# Patient Record
Sex: Female | Born: 1945 | Race: Black or African American | Hispanic: No | Marital: Married | Smoking: Never smoker
Health system: Southern US, Community
[De-identification: ages and names within clinical notes are randomized; demographics above are authoritative.]

## PROBLEM LIST (undated history)

## (undated) DIAGNOSIS — Z992 Dependence on renal dialysis: Secondary | ICD-10-CM

## (undated) DIAGNOSIS — N289 Disorder of kidney and ureter, unspecified: Secondary | ICD-10-CM

## (undated) DIAGNOSIS — E119 Type 2 diabetes mellitus without complications: Secondary | ICD-10-CM

## (undated) HISTORY — PX: HAND AMPUTATION: SUR26

## (undated) HISTORY — PX: LEG AMPUTATION: SHX1105

## (undated) HISTORY — PX: COLOSTOMY: SHX63

## (undated) HISTORY — PX: ABDOMINAL HYSTERECTOMY: SHX81

---

## 2014-09-30 ENCOUNTER — Emergency Department (HOSPITAL_BASED_OUTPATIENT_CLINIC_OR_DEPARTMENT_OTHER)
Admission: EM | Admit: 2014-09-30 | Discharge: 2014-09-30 | Disposition: A | Discharge status: Home / Self Care | Payer: Medicare Other | Attending: Emergency Medicine | Admitting: Emergency Medicine

## 2014-09-30 ENCOUNTER — Emergency Department (HOSPITAL_BASED_OUTPATIENT_CLINIC_OR_DEPARTMENT_OTHER): Payer: Medicare Other

## 2014-09-30 ENCOUNTER — Encounter (HOSPITAL_BASED_OUTPATIENT_CLINIC_OR_DEPARTMENT_OTHER): Payer: Self-pay | Admitting: *Deleted

## 2014-09-30 DIAGNOSIS — IMO0002 Reserved for concepts with insufficient information to code with codable children: Secondary | ICD-10-CM

## 2014-09-30 DIAGNOSIS — Z992 Dependence on renal dialysis: Secondary | ICD-10-CM | POA: Diagnosis not present

## 2014-09-30 DIAGNOSIS — M79645 Pain in left finger(s): Secondary | ICD-10-CM | POA: Diagnosis present

## 2014-09-30 DIAGNOSIS — L03012 Cellulitis of left finger: Secondary | ICD-10-CM | POA: Insufficient documentation

## 2014-09-30 DIAGNOSIS — E119 Type 2 diabetes mellitus without complications: Secondary | ICD-10-CM | POA: Diagnosis not present

## 2014-09-30 DIAGNOSIS — N186 End stage renal disease: Secondary | ICD-10-CM | POA: Diagnosis not present

## 2014-09-30 HISTORY — DX: Type 2 diabetes mellitus without complications: E11.9

## 2014-09-30 HISTORY — DX: Disorder of kidney and ureter, unspecified: N28.9

## 2014-09-30 HISTORY — DX: Dependence on renal dialysis: Z99.2

## 2014-09-30 LAB — CBC WITH DIFFERENTIAL/PLATELET
BASOS ABS: 0.1 10*3/uL (ref 0.0–0.1)
Basophils Relative: 1 % (ref 0–1)
EOS PCT: 2 % (ref 0–5)
Eosinophils Absolute: 0.1 10*3/uL (ref 0.0–0.7)
HEMATOCRIT: 52.8 % — AB (ref 36.0–46.0)
Hemoglobin: 16.7 g/dL — ABNORMAL HIGH (ref 12.0–15.0)
LYMPHS PCT: 20 % (ref 12–46)
Lymphs Abs: 1.5 10*3/uL (ref 0.7–4.0)
MCH: 28.4 pg (ref 26.0–34.0)
MCHC: 31.6 g/dL (ref 30.0–36.0)
MCV: 89.6 fL (ref 78.0–100.0)
Monocytes Absolute: 0.6 10*3/uL (ref 0.1–1.0)
Monocytes Relative: 8 % (ref 3–12)
Neutro Abs: 5.3 10*3/uL (ref 1.7–7.7)
Neutrophils Relative %: 69 % (ref 43–77)
Platelets: 274 10*3/uL (ref 150–400)
RBC: 5.89 MIL/uL — ABNORMAL HIGH (ref 3.87–5.11)
RDW: 20.6 % — ABNORMAL HIGH (ref 11.5–15.5)
WBC: 7.7 10*3/uL (ref 4.0–10.5)

## 2014-09-30 LAB — BASIC METABOLIC PANEL
ANION GAP: 19 — AB (ref 5–15)
BUN: 35 mg/dL — ABNORMAL HIGH (ref 6–23)
CO2: 22 meq/L (ref 19–32)
Calcium: 7.8 mg/dL — ABNORMAL LOW (ref 8.4–10.5)
Chloride: 95 mEq/L — ABNORMAL LOW (ref 96–112)
Creatinine, Ser: 6.8 mg/dL — ABNORMAL HIGH (ref 0.50–1.10)
GFR calc Af Amer: 6 mL/min — ABNORMAL LOW (ref >90)
GFR calc non Af Amer: 6 mL/min — ABNORMAL LOW (ref >90)
Glucose, Bld: 163 mg/dL — ABNORMAL HIGH (ref 70–99)
Potassium: 4.5 mEq/L (ref 3.7–5.3)
Sodium: 136 mEq/L — ABNORMAL LOW (ref 137–147)

## 2014-09-30 LAB — I-STAT CG4 LACTIC ACID, ED: Lactic Acid, Venous: 1.8 mmol/L (ref 0.5–2.2)

## 2014-09-30 MED ORDER — DIPHENHYDRAMINE HCL 50 MG/ML IJ SOLN
25.0000 mg | Freq: Once | INTRAMUSCULAR | Status: AC
  Administered 2014-09-30: 25 mg via INTRAVENOUS
  Filled 2014-09-30: qty 1

## 2014-09-30 MED ORDER — TRAMADOL HCL 50 MG PO TABS: 50.0000 mg | ORAL_TABLET | Freq: Four times a day (QID) | ORAL | Status: AC | PRN

## 2014-09-30 MED ORDER — PIPERACILLIN-TAZOBACTAM 3.375 G IVPB 30 MIN
3.3750 g | Freq: Once | INTRAVENOUS | Status: AC
  Administered 2014-09-30: 3.375 g via INTRAVENOUS
  Filled 2014-09-30 (×2): qty 50

## 2014-09-30 MED ORDER — MORPHINE SULFATE 4 MG/ML IJ SOLN
4.0000 mg | Freq: Once | INTRAMUSCULAR | Status: AC
  Administered 2014-09-30: 4 mg via INTRAVENOUS
  Filled 2014-09-30: qty 1

## 2014-09-30 MED ORDER — CEPHALEXIN 250 MG PO CAPS: 250.0000 mg | ORAL_CAPSULE | Freq: Two times a day (BID) | ORAL | Status: AC

## 2014-09-30 NOTE — Discharge Instructions (Signed)
Take keflex as prescribed.   Take tramadol as needed for pain.   Soak finger in warm water twice a day and try to express some pus out.   [facility] or return to ED here in 2 days for recheck.  Return earlier if you have fever, severe pain, purulent discharge from hand.

## 2014-09-30 NOTE — ED Notes (Signed)
Patient transported to X-ray 

## 2014-09-30 NOTE — ED Notes (Signed)
D/c home with family- Rx x 2 given for keflex and ultram

## 2014-09-30 NOTE — ED Notes (Signed)
Pain in her left hand for a few days. Fingers are swollen. She states they are full of pus. Hx of diabetes. 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

## 2014-09-30 NOTE — ED Notes (Signed)
MD at bedside. 

## 2014-09-30 NOTE — ED Provider Notes (Addendum)
CSN: [id number]     Arrival date & time 09/30/14  1541 History   First MD Initiated Contact with Patient 09/30/14 1544     Chief Complaint  Patient presents with  . Hand Pain     (Consider location/radiation/quality/duration/timing/severity/associated sxs/prior Treatment) The history is provided by the patient.  Amanda Gomez is a 68 y.o. female hx of DM, ESRD on HD (last HD was yesterday) here with L middle finger infection. Noticed swelling L middle finger for the last 4 days. Today she had a fingerstick done and the nurse noticed that there is pus draining out of the left middle finger. Denies any fevers or chills or abdominal pain or chest pain or cough.    Past Medical History  Diagnosis Date  . Diabetes mellitus without complication   . Renal disorder   . Dialysis patient    Past Surgical History  Procedure Laterality Date  . Hand amputation    . Leg amputation    . Abdominal hysterectomy    . Colostomy     No family history on file. History  Substance Use Topics  . Smoking status: Never Smoker   . Smokeless tobacco: Not on file  . Alcohol Use: Yes   OB History    No data available     Review of Systems  Musculoskeletal:       L middle finger swelling   All other systems reviewed and are negative.     Allergies  Review of patient's allergies indicates no known allergies.  Home Medications   Prior to Admission medications   Not on File   BP 81/44 mmHg  Pulse 60  Temp(Src) 98.4 F (36.9 C) (Oral)  Resp 20  Ht 5\' 7"  (1.702 m)  Wt 217 lb (98.431 kg)  BMI 33.98 kg/m2  SpO2 98% Physical Exam  Constitutional: She is oriented to person, place, and time.  Chronically ill   HENT:  Head: Normocephalic.  Mouth/Throat: Oropharynx is clear and moist.  Eyes: Conjunctivae are normal. Pupils are equal, round, and reactive to light.  Neck: Normal range of motion. Neck supple.  Cardiovascular: Normal rate and regular rhythm.   Pulmonary/Chest: Effort normal  and breath sounds normal. No respiratory distress. She has no wheezes. She has no rales.  Abdominal: Soft. Bowel sounds are normal. She exhibits no distension. There is no tenderness. There is no rebound.  Musculoskeletal:  1+ edema bilaterally. L middle finger with obvious paronychia. No obvious tenderness on the palmar aspect of the finger.   Neurological: She is alert and oriented to person, place, and time. No cranial nerve deficit. Coordination normal.  Skin: Skin is warm and dry.  Psychiatric: She has a normal mood and affect. Her behavior is normal. Judgment and thought content normal.  Nursing note and vitals reviewed.   ED Course  Procedures (including critical care time)       Labs Review Labs Reviewed  CBC WITH DIFFERENTIAL - Abnormal; Notable for the following:    RBC 5.89 (*)    Hemoglobin 16.7 (*)    HCT 52.8 (*)    RDW 20.6 (*)    All other components within normal limits  BASIC METABOLIC PANEL - Abnormal; Notable for the following:    Sodium 136 (*)    Chloride 95 (*)    Glucose, Bld 163 (*)    BUN 35 (*)    Creatinine, Ser 6.80 (*)    Calcium 7.8 (*)    GFR calc non Af [place]  6 (*)    GFR calc Af Amer 6 (*)    Anion gap 19 (*)    All other components within normal limits  I-STAT CG4 LACTIC ACID, ED    Imaging Review Dg Finger Middle Left  09/30/2014   CLINICAL DATA:  Pain and swelling of the distal left middle finger.  EXAM: LEFT MIDDLE FINGER 2+V  COMPARISON:  None.  FINDINGS: There is no evidence of fracture or dislocation. Degenerative joint changes with osteophyte formation are noted in the distal interphalangeal joint. There is generalized soft tissue swelling of the distal middle finger.  IMPRESSION: It is generalized soft tissue swelling of the distal left middle finger. There is no evidence of osteomyelitis.   Electronically Signed   By: Sherian ReinWei-Chen  Lin M.D.   On: 09/30/2014 16:21     EKG Interpretation None      MDM   Final diagnoses:  Wound  abscess    Amanda Gomez is a 68 y.o. female here with L middle finger paronychia. Given that she is diabetic and had multiple amputations previously, I am concerned that the infection may spread. Will perform I and D. Will give zosyn and call hand surgery.   6:45 PM I called Dr. Izora Ribasoley from hand surgery. He recommend d/c with abx. WBC nl. Lactate normal. Patient's BP in the 80s-90s but per patient this is baseline. Doesn't appear septic. I did drain the paronychia and xray showed no obvious osteo. I feel that she needs to come back in 2 days for wound check since she is at risk for sepsis or osteo or felon from the paronychia.     Richardean Canalavid H Yao, MD 09/30/14 [id number]  Richardean Canalavid H Yao, MD 10/01/14 [phone]

## 2014-10-02 ENCOUNTER — Emergency Department (HOSPITAL_BASED_OUTPATIENT_CLINIC_OR_DEPARTMENT_OTHER)
Admission: EM | Admit: 2014-10-02 | Discharge: 2014-10-02 | Disposition: A | Discharge status: Home / Self Care | Payer: Medicare Other | Attending: Emergency Medicine | Admitting: Emergency Medicine

## 2014-10-02 ENCOUNTER — Encounter (HOSPITAL_BASED_OUTPATIENT_CLINIC_OR_DEPARTMENT_OTHER): Payer: Self-pay

## 2014-10-02 DIAGNOSIS — Z792 Long term (current) use of antibiotics: Secondary | ICD-10-CM | POA: Diagnosis not present

## 2014-10-02 DIAGNOSIS — Z4801 Encounter for change or removal of surgical wound dressing: Secondary | ICD-10-CM | POA: Insufficient documentation

## 2014-10-02 DIAGNOSIS — E119 Type 2 diabetes mellitus without complications: Secondary | ICD-10-CM | POA: Diagnosis not present

## 2014-10-02 DIAGNOSIS — Z992 Dependence on renal dialysis: Secondary | ICD-10-CM | POA: Insufficient documentation

## 2014-10-02 DIAGNOSIS — Z5189 Encounter for other specified aftercare: Secondary | ICD-10-CM

## 2014-10-02 MED ORDER — BACITRACIN 500 UNIT/GM EX OINT
1.0000 "application " | TOPICAL_OINTMENT | Freq: Two times a day (BID) | CUTANEOUS | Status: DC
  Filled 2014-10-02: qty 0.9

## 2014-10-02 NOTE — ED Notes (Addendum)
Pt reports she was seen Friday night for a finger infection - left hand middle finger - reports being on PO antibiotics and states she was instructed to return to ED for recheck. Pt does not know home medications.

## 2014-10-02 NOTE — ED Provider Notes (Signed)
CSN: [id number]     Arrival date & time 10/02/14  1521 History   First MD Initiated Contact with Patient 10/02/14 1637     Chief Complaint  Patient presents with  . Wound Check     (Consider location/radiation/quality/duration/timing/severity/associated sxs/prior Treatment) Patient is a 68 y.o. female presenting with wound check. The history is provided by the patient.  Wound Check This is a new problem.   Amanda Gomez is a 68 y.o. female who presents to the ED for recheck of her finger. She states she was instructed to return for recheck. The infection is in the left middle finger. Dr. Silverio LayYao did I&D of paronychia at her visit. Patient states that when she got home from her last visit the nail came off. She has been soaking the finger and taking the antibiotics and the finger feels much better. Patient is a diabetic and a dialysis patient. She has also had amputations due to infections so is being followed closely.   Past Medical History  Diagnosis Date  . Diabetes mellitus without complication   . Renal disorder   . Dialysis patient    Past Surgical History  Procedure Laterality Date  . Hand amputation    . Leg amputation    . Abdominal hysterectomy    . Colostomy     History reviewed. No pertinent family history. History  Substance Use Topics  . Smoking status: Never Smoker   . Smokeless tobacco: Not on file  . Alcohol Use: No   OB History    No data available     Review of Systems Negative except as stated in HPI   Allergies  Review of patient's allergies indicates no known allergies.  Home Medications   Prior to Admission medications   Medication Sig Start Date End Date Taking? Authorizing Provider  cephALEXin (KEFLEX) 250 MG capsule Take 1 capsule (250 mg total) by mouth 2 (two) times daily. 09/30/14  Yes Richardean Canalavid H Yao, MD  traMADol (ULTRAM) 50 MG tablet Take 1 tablet (50 mg total) by mouth every 6 (six) hours as needed. 09/30/14  Yes Richardean Canalavid H Yao, MD   BP 107/63  mmHg  Pulse 62  Temp(Src) 98.1 F (36.7 C) (Oral)  Resp 16  Ht 5\' 7"  (1.702 m)  Wt 217 lb (98.431 kg)  BMI 33.98 kg/m2  SpO2 99% Physical Exam  Constitutional: She is oriented to person, place, and time. She appears well-developed and well-nourished. No distress.  HENT:  Head: Normocephalic.  Eyes: EOM are normal.  Neck: Neck supple.  Pulmonary/Chest: Effort normal.  Abdominal: Soft. There is no tenderness.  Musculoskeletal:       Hands: Nail for left middle finger is off and wound is healing well. In comparison to pictures in char from Dr. Silverio LayYao the wound is much improved.   Neurological: She is alert and oriented to person, place, and time. No cranial nerve deficit.  Skin: Skin is warm and dry.  Psychiatric: She has a normal mood and affect. Her behavior is normal.  Nursing note and vitals reviewed.   ED Course  Procedures   MDM  68 y.o. female her for wound recheck. Much improved since visit 2 days ago. Will continue antibiotics and wound care. She will follow up with her PCP in 2 days or return here for any problems.      [address]Hope MarengoM Neese, NP 10/02/14 1657  Nelia Shiobert L Beaton, MD 10/03/14 [phone]

## 2014-10-02 NOTE — ED Notes (Signed)
Patient here for scheduled recheck of left hand middle finger nail bed. No drainage noted, nailbed looks pink after nail removed earlier in week. Taking antibiotic as ordered

## 2014-10-02 NOTE — Discharge Instructions (Signed)
Continue to clean the wound and take your antibiotics. Follow up with your doctor in 2 days or return here as needed for worsening symptoms.

## 2014-12-14 ENCOUNTER — Encounter (HOSPITAL_BASED_OUTPATIENT_CLINIC_OR_DEPARTMENT_OTHER): Payer: Medicare Other | Attending: General Surgery

## 2015-06-27 DEATH — deceased

## 2016-04-03 IMAGING — CR DG FINGER MIDDLE 2+V*L*
3 series · 3 of 3 positions shown · non-contrast
Comparison: None.

CLINICAL DATA: Pain and swelling of the distal left middle finger.

EXAM:
LEFT MIDDLE FINGER 2+V

[x finger pa left]
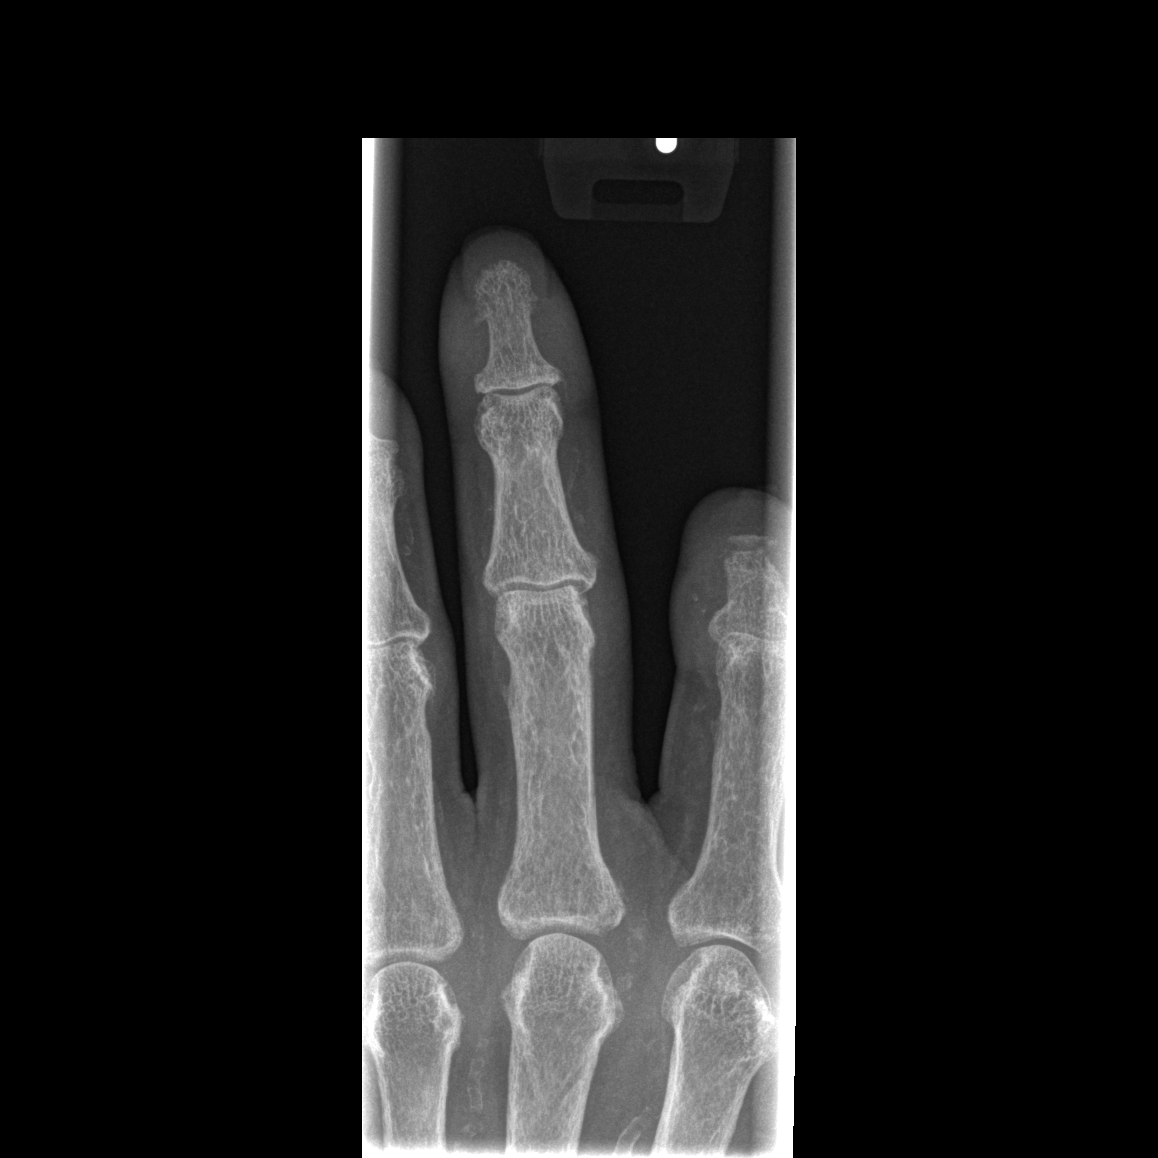

[x finger obl. left]
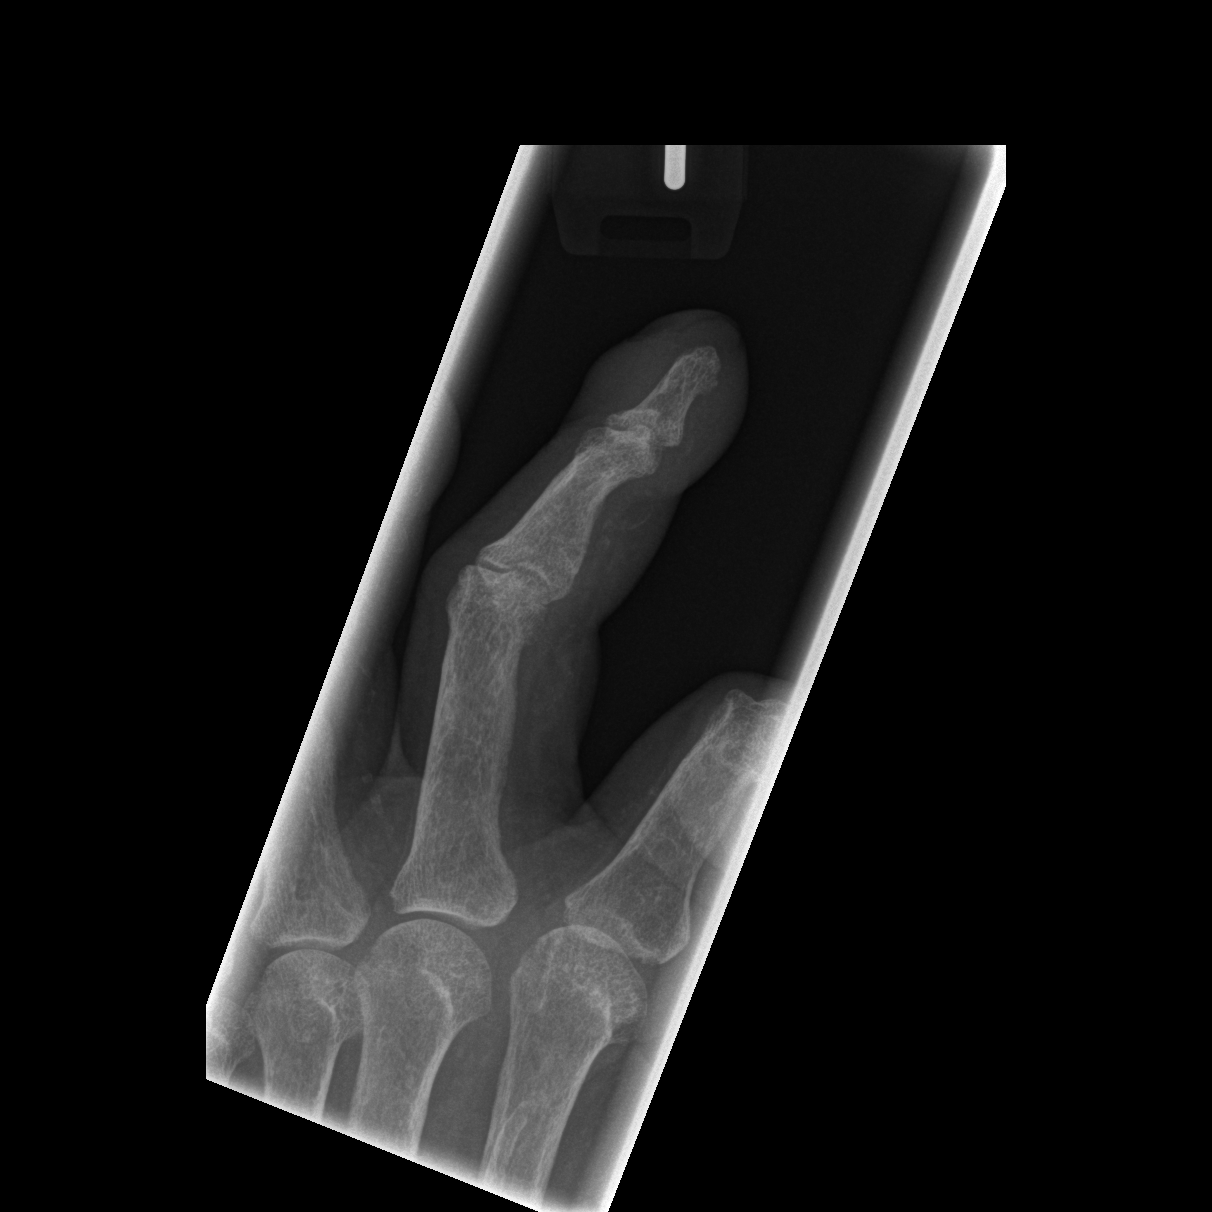

[x finger lateral left]
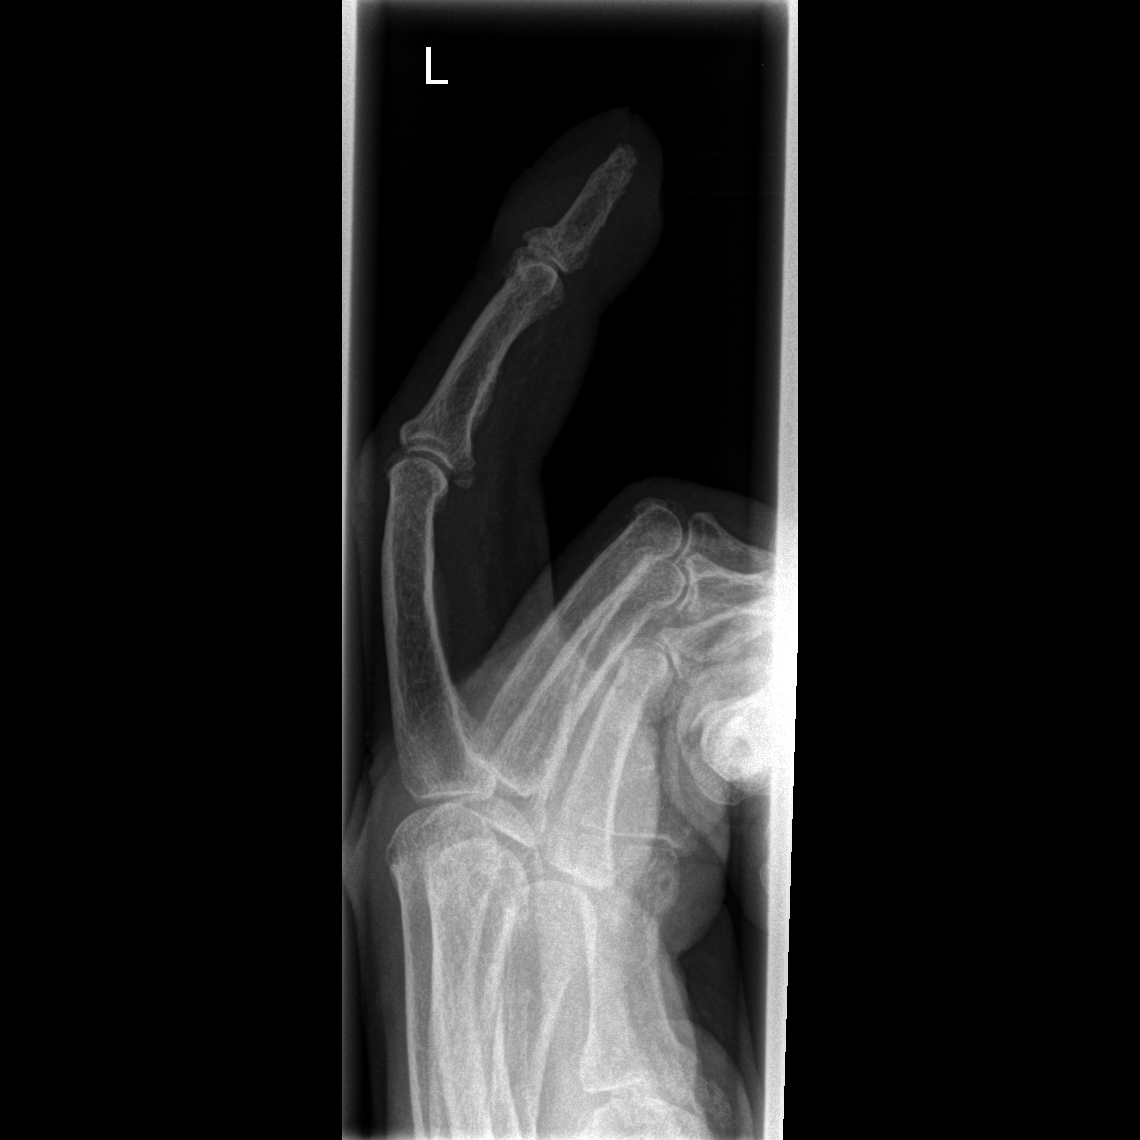

[3 of 3 positions shown; findings below may reference images not displayed]

FINDINGS: There is no evidence of fracture or dislocation. Degenerative joint
changes with osteophyte formation are noted in the distal
interphalangeal joint. There is generalized soft tissue swelling of
the distal middle finger.
IMPRESSION: It is generalized soft tissue swelling of the distal left middle
finger. There is no evidence of osteomyelitis.
